# Patient Record
Sex: Male | Born: 1990 | Race: Black or African American | Hispanic: No | Marital: Single
Health system: Southern US, Community
[De-identification: ages and names within clinical notes are randomized; demographics above are authoritative.]

## PROBLEM LIST (undated history)

## (undated) DIAGNOSIS — I10 Essential (primary) hypertension: Secondary | ICD-10-CM

---

## 2018-01-09 ENCOUNTER — Emergency Department (HOSPITAL_COMMUNITY)
Admission: EM | Admit: 2018-01-09 | Discharge: 2018-01-09 | Disposition: A | Discharge status: Home / Self Care | Payer: Self-pay | Attending: Emergency Medicine | Admitting: Emergency Medicine

## 2018-01-09 ENCOUNTER — Emergency Department (HOSPITAL_COMMUNITY): Payer: Self-pay

## 2018-01-09 ENCOUNTER — Other Ambulatory Visit: Payer: Self-pay

## 2018-01-09 ENCOUNTER — Encounter (HOSPITAL_COMMUNITY): Payer: Self-pay

## 2018-01-09 DIAGNOSIS — S90414A Abrasion, right lesser toe(s), initial encounter: Secondary | ICD-10-CM

## 2018-01-09 DIAGNOSIS — Y998 Other external cause status: Secondary | ICD-10-CM | POA: Insufficient documentation

## 2018-01-09 DIAGNOSIS — Y9355 Activity, bike riding: Secondary | ICD-10-CM | POA: Insufficient documentation

## 2018-01-09 DIAGNOSIS — S93401A Sprain of unspecified ligament of right ankle, initial encounter: Secondary | ICD-10-CM

## 2018-01-09 DIAGNOSIS — Z91010 Allergy to peanuts: Secondary | ICD-10-CM | POA: Insufficient documentation

## 2018-01-09 DIAGNOSIS — Z23 Encounter for immunization: Secondary | ICD-10-CM | POA: Insufficient documentation

## 2018-01-09 DIAGNOSIS — I10 Essential (primary) hypertension: Secondary | ICD-10-CM | POA: Insufficient documentation

## 2018-01-09 DIAGNOSIS — Y92410 Unspecified street and highway as the place of occurrence of the external cause: Secondary | ICD-10-CM | POA: Insufficient documentation

## 2018-01-09 HISTORY — DX: Essential (primary) hypertension: I10

## 2018-01-09 MED ORDER — TETANUS-DIPHTH-ACELL PERTUSSIS 5-2.5-18.5 LF-MCG/0.5 IM SUSP
0.5000 mL | Freq: Once | INTRAMUSCULAR | Status: AC
  Administered 2018-01-09: 0.5 mL via INTRAMUSCULAR
  Filled 2018-01-09: qty 0.5

## 2018-01-09 MED ORDER — HYDROCODONE-ACETAMINOPHEN 5-325 MG PO TABS
1.0000 | ORAL_TABLET | ORAL | Status: DC
  Filled 2018-01-09: qty 1

## 2018-01-09 MED ORDER — ACETAMINOPHEN 325 MG PO TABS
650.0000 mg | ORAL_TABLET | Freq: Once | ORAL | Status: AC
  Administered 2018-01-09: 650 mg via ORAL
  Filled 2018-01-09: qty 2

## 2018-01-09 NOTE — Discharge Instructions (Addendum)
Apply antibiotic ointment to the abrasions, use the splint for comfort, take Tylenol or ibuprofen as needed for pain

## 2018-01-09 NOTE — ED Notes (Signed)
Patient transported to X-ray 

## 2018-01-09 NOTE — Progress Notes (Signed)
Orthopedic Tech Progress Note Patient Details:  Martin Kelly December 26, 1990 [id number]  Ortho Devices Type of Ortho Device: ASO Ortho Device/Splint Location: LRE Ortho Device/Splint Interventions: Adjustment, Application, Ordered   Post Interventions Patient Tolerated: Well Instructions Provided: Care of device, Adjustment of device   Donald Pore 01/09/2018, 7:26 PM
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
## 2018-01-09 NOTE — ED Notes (Signed)
E-signature not available, pt verbalized understanding of DC instructions  

## 2018-01-09 NOTE — ED Notes (Signed)
ED Provider at bedside. 
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
